# Patient Record
Sex: Male | Born: 2000 | Race: Black or African American | Hispanic: No | Marital: Single | Smoking: Current every day smoker
Health system: Southern US, Community
[De-identification: ages and names within clinical notes are randomized; demographics above are authoritative.]

## PROBLEM LIST (undated history)

## (undated) DIAGNOSIS — Z789 Other specified health status: Secondary | ICD-10-CM

## (undated) HISTORY — DX: Other specified health status: Z78.9

---
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
## 2018-09-09 ENCOUNTER — Emergency Department (HOSPITAL_COMMUNITY): Payer: BC Managed Care – PPO

## 2018-09-09 ENCOUNTER — Emergency Department (HOSPITAL_COMMUNITY)
Admission: EM | Admit: 2018-09-09 | Discharge: 2018-09-09 | Disposition: A | Discharge status: Home / Self Care | Payer: BC Managed Care – PPO | Attending: Emergency Medicine | Admitting: Emergency Medicine

## 2018-09-09 ENCOUNTER — Other Ambulatory Visit: Payer: Self-pay

## 2018-09-09 DIAGNOSIS — J069 Acute upper respiratory infection, unspecified: Secondary | ICD-10-CM | POA: Insufficient documentation

## 2018-09-09 DIAGNOSIS — B9789 Other viral agents as the cause of diseases classified elsewhere: Secondary | ICD-10-CM

## 2018-09-09 DIAGNOSIS — H9203 Otalgia, bilateral: Secondary | ICD-10-CM | POA: Diagnosis present

## 2018-09-09 MED ORDER — ACETAMINOPHEN 325 MG PO TABS
650.0000 mg | ORAL_TABLET | Freq: Once | ORAL | Status: AC
  Administered 2018-09-09: 650 mg via ORAL
  Filled 2018-09-09: qty 2

## 2018-09-09 NOTE — ED Notes (Signed)
Patient transported to X-ray 

## 2018-09-09 NOTE — ED Provider Notes (Signed)
[facility] EMERGENCY DEPARTMENT Provider Note   CSN: [id number] Arrival date & time: 09/09/18  0539    History   Chief Complaint Chief Complaint  Patient presents with  . Nasal Congestion    flu like symptoms    HPI Travis Powers is a 18 y.o. male.     HPI   18 year old male significant medical history, presents with myalgias, cough, rhinorrhea for 3 days.  He notes associated subjective fevers, chills, sputum production, sore throat, bilateral ear pain.  He states he feels like he has the flu.  He denies any associated nausea, vomiting, abdominal pain, chest pain, shortness of breath.  He denies any known sick exposures.  Past Medical History:  Diagnosis Date  . Medical history non-contributory     Patient Active Problem List   Diagnosis Date Noted  . Acne vulgaris 02/21/2015    No past surgical history on file.      Home Medications    Prior to Admission medications   Medication Sig Start Date End Date Taking? Authorizing Provider  clindamycin-benzoyl peroxide (BENZACLIN) gel Apply topically 2 (two) times daily. Patient not taking: Reported on 02/15/2016 02/21/15   Minda Meo, MD    Family History Family History  Problem Relation Age of Onset  . Sickle cell trait Mother   . Diabetes Maternal Grandmother   . Sickle cell trait Maternal Grandfather     Social History Social History   Tobacco Use  . Smoking status: Never Smoker  . Smokeless tobacco: Never Used  Substance Use Topics  . Alcohol use: No  . Drug use: No     Allergies   Patient has no known allergies.   Review of Systems Review of Systems  Constitutional: Negative for chills and fever.  HENT: Positive for congestion, ear pain, rhinorrhea and sore throat.   Respiratory: Positive for cough. Negative for shortness of breath.   Cardiovascular: Negative for chest pain.  Gastrointestinal: Negative for abdominal pain, nausea and vomiting.  Skin: Negative for rash and  wound.     Physical Exam Updated Vital Signs BP 131/77 (BP Location: Right Arm)   Pulse 82   Temp 98.9 F (37.2 C) (Oral)   Resp 16   Ht 6\' 4"  (1.93 m)   Wt 72.6 kg   SpO2 100%   BMI 19.48 kg/m   Physical Exam Vitals signs and nursing note reviewed.  Constitutional:      Appearance: He is well-developed.  HENT:     Head: Normocephalic and atraumatic.     Right Ear: Tympanic membrane normal.     Left Ear: Tympanic membrane normal.     Nose: Nose normal.     Mouth/Throat:     Lips: Pink.     Mouth: Mucous membranes are moist.  Eyes:     Conjunctiva/sclera: Conjunctivae normal.  Neck:     Musculoskeletal: Neck supple.  Cardiovascular:     Rate and Rhythm: Normal rate and regular rhythm.     Heart sounds: Normal heart sounds. No murmur.  Pulmonary:     Effort: Pulmonary effort is normal. No respiratory distress.     Breath sounds: Normal breath sounds. No wheezing or rales.  Abdominal:     General: Bowel sounds are normal. There is no distension.     Palpations: Abdomen is soft.     Tenderness: There is no abdominal tenderness.  Musculoskeletal: Normal range of motion.        General: No tenderness or deformity.  Skin:    General: Skin is warm and dry.     Findings: No erythema or rash.  Neurological:     Mental Status: He is alert and oriented to person, place, and time.  Psychiatric:        Behavior: Behavior normal.      ED Treatments / Results  Labs (all labs ordered are listed, but only abnormal results are displayed) Labs Reviewed - No data to display  EKG None  Radiology Dg Chest 2 View  Result Date: 09/09/2018 CLINICAL DATA:  Fever, body aches, and cough since Sunday. EXAM: CHEST - 2 VIEW COMPARISON:  None. FINDINGS: The heart size and mediastinal contours are within normal limits. Both lungs are clear. The visualized skeletal structures are unremarkable. IMPRESSION: No active cardiopulmonary disease. Electronically Signed   By: Obie Dredge  M.D.   On: 09/09/2018 08:16    Procedures Procedures (including critical care time)  Medications Ordered in ED Medications  acetaminophen (TYLENOL) tablet 650 mg (650 mg Oral Given 09/09/18 0742)     Initial Impression / Assessment and Plan / ED Course  I have reviewed the triage vital signs and the nursing notes.  Pertinent labs & imaging results that were available during my care of the patient were reviewed by me and considered in my medical decision making (see chart for details).        Presents with URI symptoms, cough, myalgias, rhinorrhea.  He is very well-appearing, no acute distress, nontoxic, non-lethargic.  Vital signs stable.  He is talking in full sentences, no accessory muscle use, lungs are clear to auscultation bilaterally.  Chest x-ray shows no acute pneumonia, pneumothorax, pleural effusion.  Flu swab not obtained given will not change management.  Encourage symptom management.  Given strict return precautions.  Discussed with patient and he expresses understanding and is agreeable with plan.  No emergent medical conditions identified at this time.  Patient ready and stable for discharge.  Final Clinical Impressions(s) / ED Diagnoses   Final diagnoses:  None    ED Discharge Orders    None       Clayborne Artist, PA-C 09/09/18 1456    Derwood Kaplan, MD 09/12/18 1055

## 2018-09-09 NOTE — Discharge Instructions (Signed)
Take Tylenol or Motrin as needed for body aches.  Drink plenty of fluids.  Rest.  Follow-up with your primary care provider in 2 days for continued evaluation.  Return to the ED immediately for new or worsening symptoms or concerns, such as chest pain, shortness of breath, persistent fevers, vomiting or any concerns at all.

## 2018-09-09 NOTE — ED Triage Notes (Signed)
Per pt he has been having chills, body ache, cough, congestion since Sunday. Tired and weak. No appetite. Pt is alert oriented x 4

## 2019-10-12 IMAGING — CR DG CHEST 2V
2 series · 2 of 2 positions shown · non-contrast
Comparison: None.

CLINICAL DATA: Fever, body aches, and cough since [REDACTED].

EXAM:
CHEST - 2 VIEW

[chest pa]
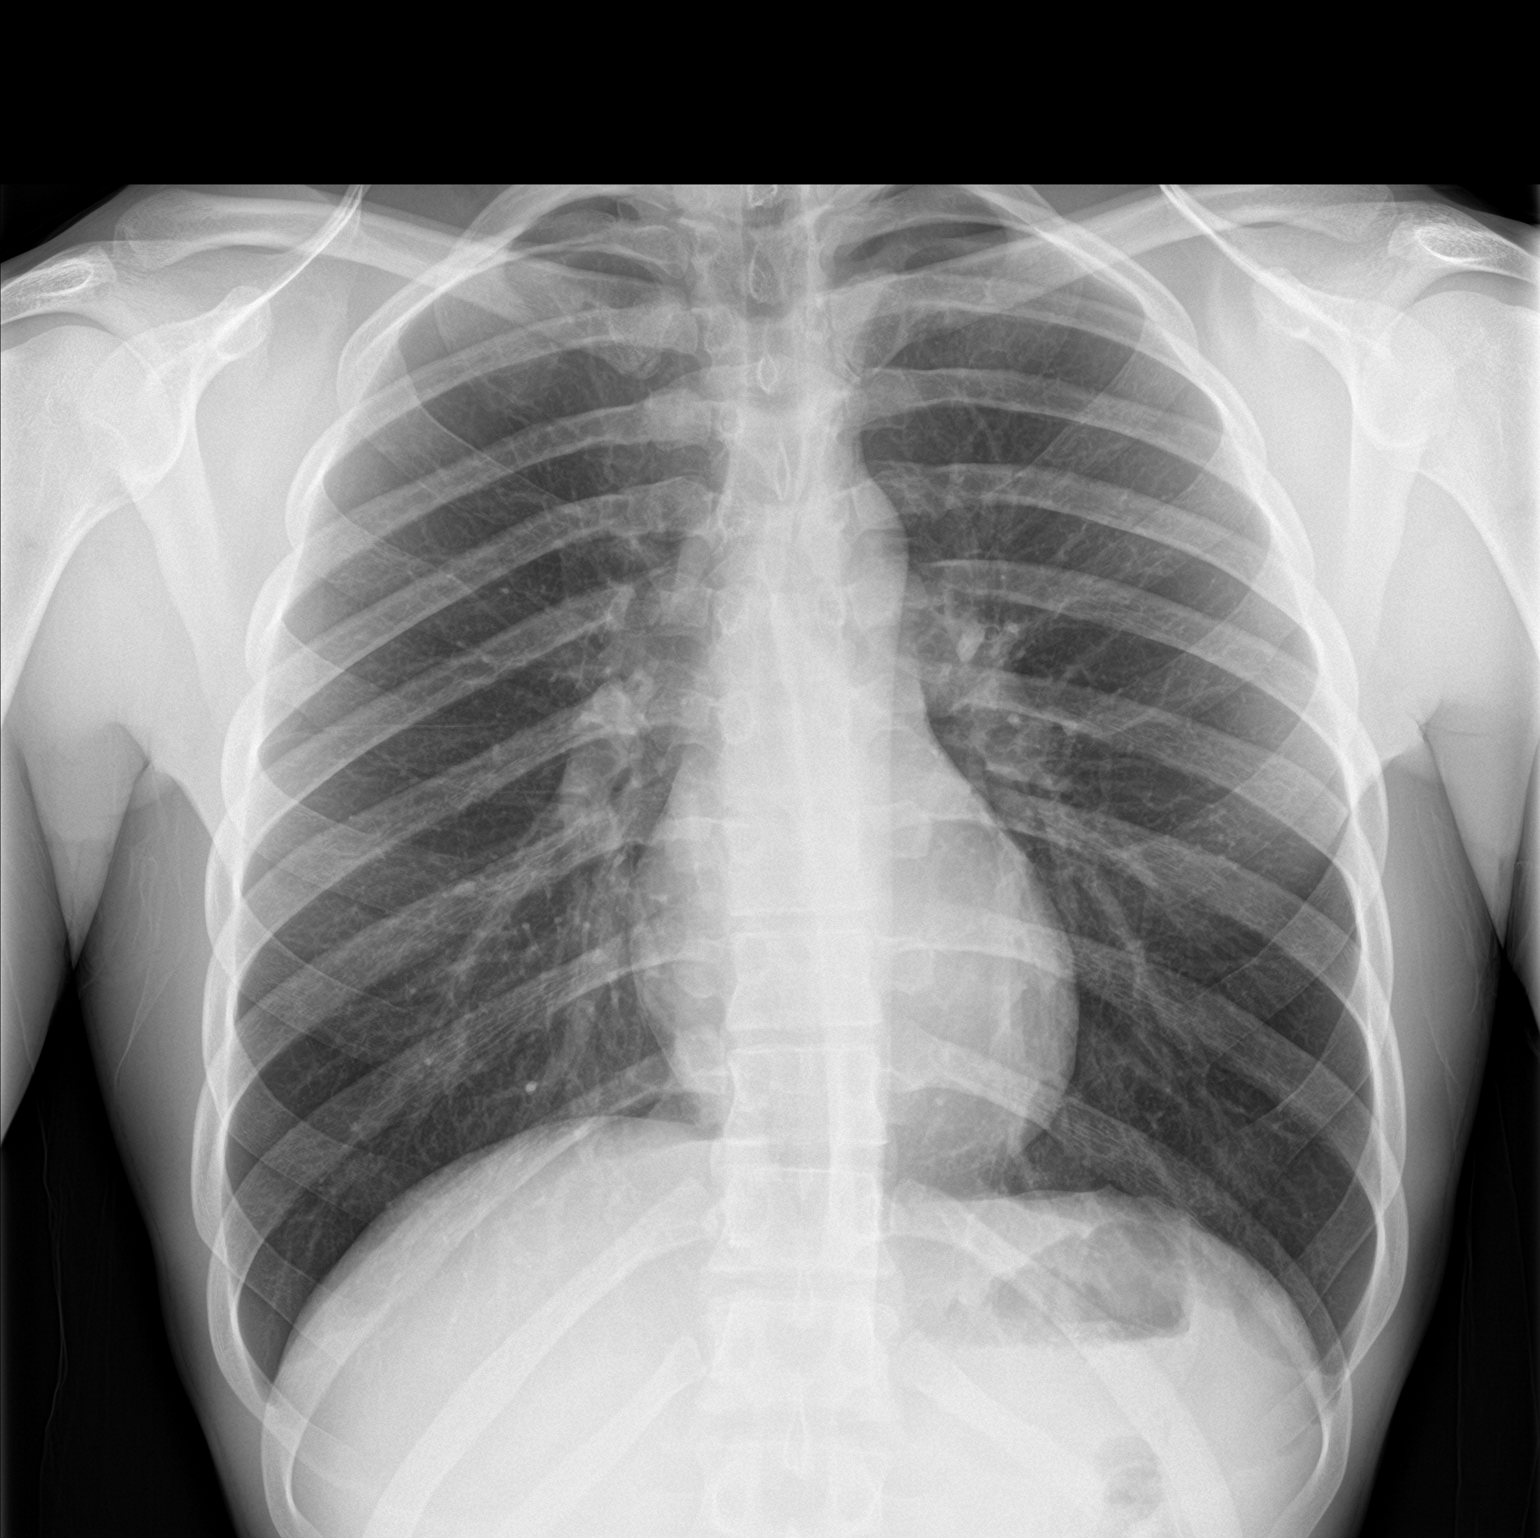

[chest lat]
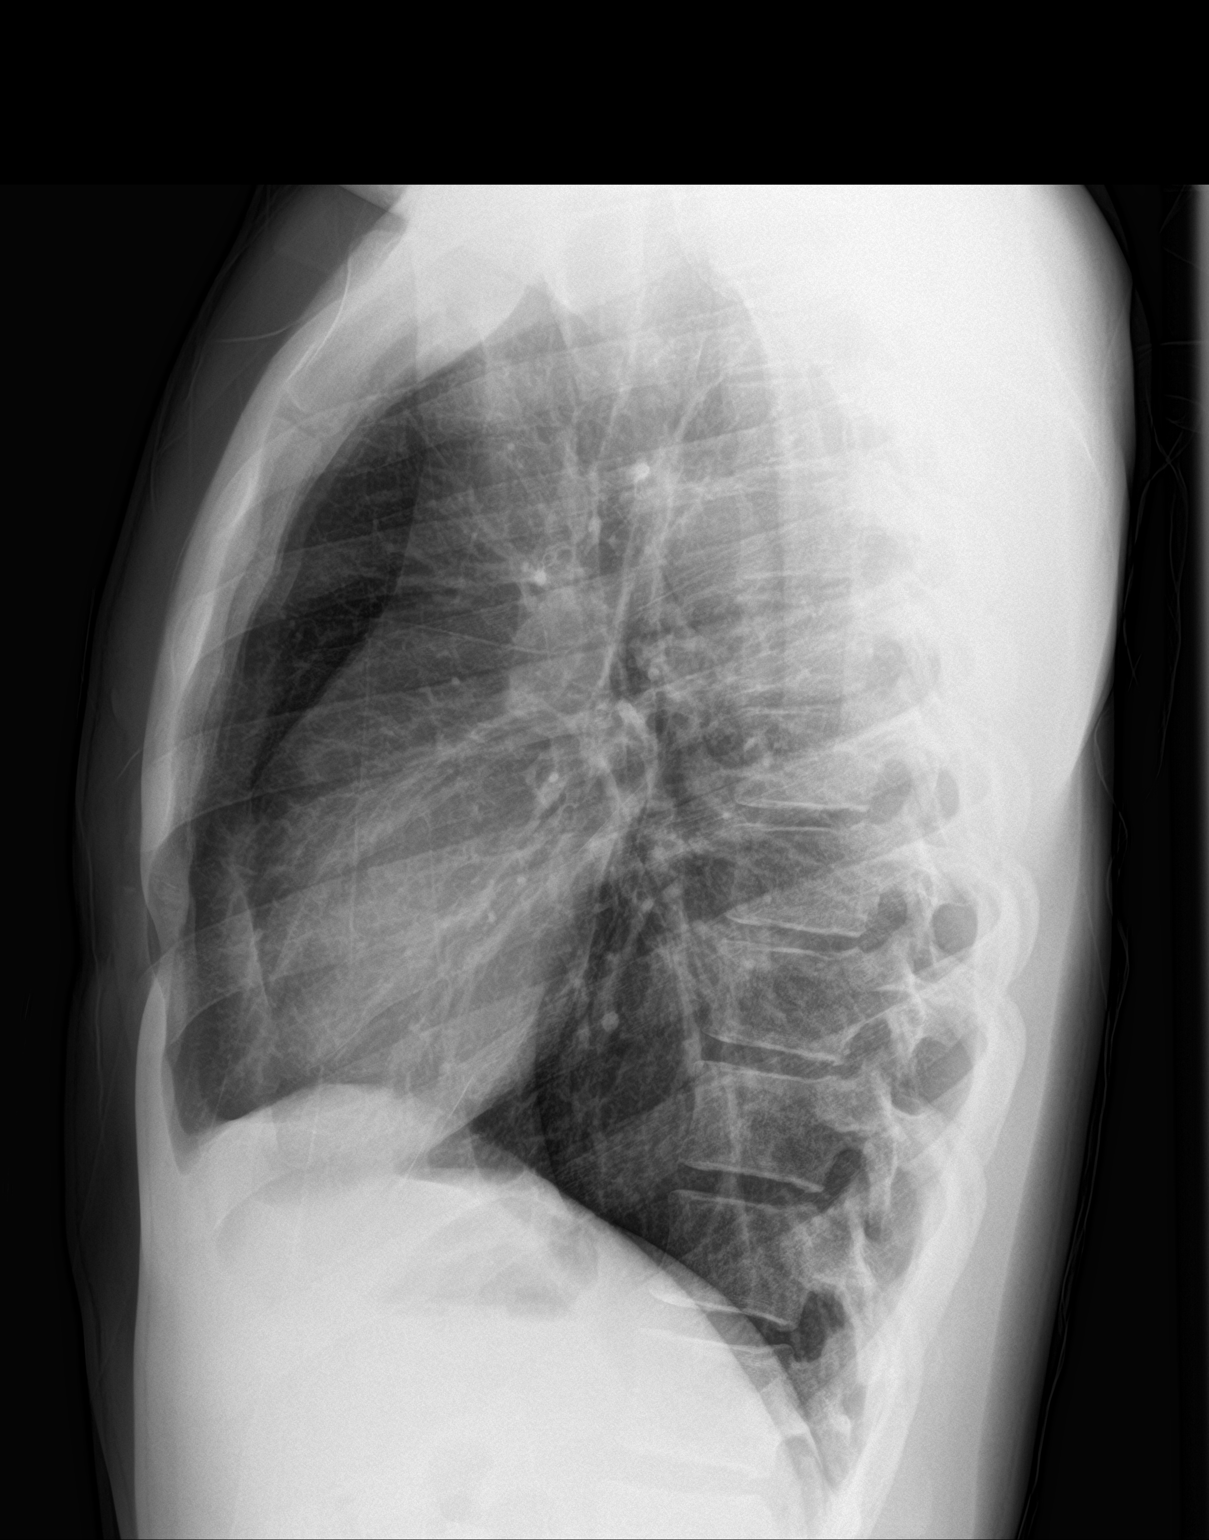

[2 of 2 positions shown; findings below may reference images not displayed]

FINDINGS: The heart size and mediastinal contours are within normal limits.
Both lungs are clear. The visualized skeletal structures are
unremarkable.
IMPRESSION: No active cardiopulmonary disease.

## 2019-12-07 ENCOUNTER — Encounter: Payer: Self-pay | Admitting: Pediatrics

## 2020-03-26 ENCOUNTER — Encounter (HOSPITAL_COMMUNITY): Payer: Self-pay | Admitting: Emergency Medicine

## 2020-03-26 ENCOUNTER — Ambulatory Visit (HOSPITAL_COMMUNITY)
Admission: EM | Admit: 2020-03-26 | Discharge: 2020-03-26 | Disposition: A | Discharge status: Home / Self Care | Payer: BC Managed Care – PPO | Attending: Internal Medicine | Admitting: Internal Medicine

## 2020-03-26 ENCOUNTER — Other Ambulatory Visit: Payer: Self-pay

## 2020-03-26 DIAGNOSIS — Z20822 Contact with and (suspected) exposure to covid-19: Secondary | ICD-10-CM | POA: Diagnosis not present

## 2020-03-26 NOTE — Discharge Instructions (Signed)

## 2020-03-26 NOTE — ED Notes (Signed)
Declined to see provider

## 2020-03-26 NOTE — ED Triage Notes (Signed)
Chills for 4 days, diarrhea, poor sleeping, runny nose, chest congestion

## 2020-03-27 LAB — SARS CORONAVIRUS 2 (TAT 6-24 HRS): SARS Coronavirus 2: NEGATIVE

## 2020-06-09 ENCOUNTER — Other Ambulatory Visit: Payer: Self-pay

## 2020-06-09 ENCOUNTER — Ambulatory Visit
Admission: EM | Admit: 2020-06-09 | Discharge: 2020-06-09 | Disposition: A | Discharge status: Home / Self Care | Payer: BC Managed Care – PPO | Attending: Emergency Medicine | Admitting: Emergency Medicine

## 2020-06-09 DIAGNOSIS — Z202 Contact with and (suspected) exposure to infections with a predominantly sexual mode of transmission: Secondary | ICD-10-CM | POA: Diagnosis not present

## 2020-06-09 MED ORDER — DOXYCYCLINE HYCLATE 100 MG PO CAPS: 100.0000 mg | ORAL_CAPSULE | Freq: Two times a day (BID) | ORAL | 0 refills | Status: AC

## 2020-06-09 MED ORDER — CEFTRIAXONE SODIUM 500 MG IJ SOLR
500.0000 mg | Freq: Once | INTRAMUSCULAR | Status: AC
  Administered 2020-06-09: 500 mg via INTRAMUSCULAR

## 2020-06-09 MED ORDER — METRONIDAZOLE 500 MG PO TABS
2000.0000 mg | ORAL_TABLET | Freq: Once | ORAL | Status: AC
  Administered 2020-06-09: 2000 mg via ORAL

## 2020-06-09 NOTE — ED Provider Notes (Signed)
____________________________________________  Time seen: Approximately 1:12 PM  I have reviewed the triage vital signs and the nursing notes.   HISTORY  Chief Complaint S74.5   Historian Patient     HPI Travis Powers is a 19 y.o. male presents to the urgent care with concern for STD exposure.  Patient states that his girlfriend recently tested positive for chlamydia.  No penile discharge, dysuria or increased urinary frequency.  Patient denies low back pain, nausea or vomiting.  No other alleviating measures have been attempted.   Past Medical History:  Diagnosis Date  . Medical history non-contributory      Immunizations up to date:  Yes.     Past Medical History:  Diagnosis Date  . Medical history non-contributory     Patient Active Problem List   Diagnosis Date Noted  . Acne vulgaris 02/21/2015    History reviewed. No pertinent surgical history.  Prior to Admission medications   Medication Sig Start Date End Date Taking? Authorizing Provider  amoxicillin (AMOXIL) 500 MG capsule Take 500 mg by mouth 3 (three) times daily. 05/10/20   [provider]  clindamycin-benzoyl peroxide (BENZACLIN) gel Apply topically 2 (two) times daily. Patient not taking: Reported on 02/15/2016 02/21/15   Minda Meo, MD  doxycycline (VIBRAMYCIN) 100 MG capsule Take 1 capsule (100 mg total) by mouth 2 (two) times daily for 7 days. 06/09/20 06/16/20  Orvil Feil, PA-C    Allergies Patient has no known allergies.  Family History  Problem Relation Age of Onset  . Sickle cell trait Mother   . Diabetes Maternal Grandmother   . Sickle cell trait Maternal Grandfather   . Healthy Father     Social History Social History   Tobacco Use  . Smoking status: Never Smoker  . Smokeless tobacco: Never Used  Substance Use Topics  . Alcohol use: Yes  . Drug use: Yes    Types: Marijuana     Review of Systems  Constitutional: No fever/chills Eyes:  No  discharge ENT: No upper respiratory complaints. Respiratory: no cough. No SOB/ use of accessory muscles to breath Gastrointestinal:   No nausea, no vomiting.  No diarrhea.  No constipation. Musculoskeletal: Negative for musculoskeletal pain. Skin: Negative for rash, abrasions, lacerations, ecchymosis.    ____________________________________________   PHYSICAL EXAM:  VITAL SIGNS: ED Triage Vitals  Enc Vitals Group     BP 06/09/20 1213 120/83     Pulse Rate 06/09/20 1213 63     Resp 06/09/20 1213 18     Temp 06/09/20 1213 98.1 F (36.7 C)     Temp Source 06/09/20 1213 Oral     SpO2 06/09/20 1213 98 %     Weight --      Height --      Head Circumference --      Peak Flow --      Pain Score 06/09/20 1212 0     Pain Loc --      Pain Edu? --      Excl. in GC? --      Constitutional: Alert and oriented. Well appearing and in no acute distress. Eyes: Conjunctivae are normal. PERRL. EOMI. Head: Atraumatic. Cardiovascular: Normal rate, regular rhythm. Normal S1 and S2.  Good peripheral circulation. Respiratory: Normal respiratory effort without tachypnea or retractions. Lungs CTAB. Good air entry to the bases with no decreased or absent breath sounds Skin:  Skin is warm, dry and intact. No rash noted. Psychiatric: Mood and affect are normal  for age. Speech and behavior are normal.   ____________________________________________   LABS (all labs ordered are listed, but only abnormal results are displayed)  Labs Reviewed  CYTOLOGY, (ORAL, ANAL, URETHRAL) ANCILLARY ONLY   ____________________________________________  EKG   ____________________________________________  RADIOLOGY  No results found.  ____________________________________________    PROCEDURES  Procedure(s) performed:     Procedures     Medications  cefTRIAXone (ROCEPHIN) injection 500 mg (500 mg Intramuscular Given 06/09/20 1236)  metroNIDAZOLE (FLAGYL) tablet 2,000 mg (2,000 mg Oral Given  06/09/20 1234)     ____________________________________________   INITIAL IMPRESSION / ASSESSMENT AND PLAN / ED COURSE  Pertinent labs & imaging results that were available during my care of the patient were reviewed by me and considered in my medical decision making (see chart for details).    Assessment and plan STD exposure 19 year old male presents to the urgent care with concern for possible STD exposure.  Vital signs are reassuring at triage.  On physical exam, patient was alert, active and nontoxic-appearing.  He denies penile discharge, dysuria, low back pain or increased urinary frequency.  Patient elected to be empirically treated for STDs.  He was given Rocephin IM in the urgent care as well as 2 g of Flagyl.  He was discharged with doxycycline twice daily for the next 7 days.  Patient was advised to abstain from unprotected sex for the next 2 weeks.  All patient questions were answered.     ____________________________________________  FINAL CLINICAL IMPRESSION(S) / ED DIAGNOSES  Final diagnoses:  STD exposure      NEW MEDICATIONS STARTED DURING THIS VISIT:  ED Discharge Orders         Ordered    doxycycline (VIBRAMYCIN) 100 MG capsule  2 times daily        06/09/20 1231              This chart was dictated using voice recognition software/Dragon. Despite best efforts to proofread, errors can occur which can change the meaning. Any change was purely unintentional.     Orvil Feil, PA-C 06/09/20 1318

## 2020-06-09 NOTE — Discharge Instructions (Addendum)
Take doxycycline twice daily for the next 7 days. Please abstain from unprotected sex for the next 2 weeks.

## 2020-06-09 NOTE — ED Triage Notes (Signed)
Pt is here wanting STD testing after informing him 2 days ago that she tested POSITIVE for Chlamydia, pt last had unprotected sex 5 days ago.

## 2020-06-14 LAB — CYTOLOGY, (ORAL, ANAL, URETHRAL) ANCILLARY ONLY
Chlamydia: NEGATIVE
Comment: NEGATIVE
Comment: NEGATIVE
Comment: NORMAL
Neisseria Gonorrhea: NEGATIVE
Trichomonas: NEGATIVE

## 2021-07-26 ENCOUNTER — Telehealth: Payer: BC Managed Care – PPO | Admitting: Nurse Practitioner

## 2021-07-26 DIAGNOSIS — J0140 Acute pansinusitis, unspecified: Secondary | ICD-10-CM

## 2021-07-26 MED ORDER — AMOXICILLIN-POT CLAVULANATE 875-125 MG PO TABS: 1.0000 | ORAL_TABLET | Freq: Two times a day (BID) | ORAL | 0 refills | Status: AC

## 2021-07-26 NOTE — Progress Notes (Signed)

## 2021-08-24 ENCOUNTER — Telehealth: Payer: Self-pay

## 2021-08-24 ENCOUNTER — Telehealth: Payer: Self-pay | Admitting: Physician Assistant

## 2021-08-24 DIAGNOSIS — K047 Periapical abscess without sinus: Secondary | ICD-10-CM

## 2021-08-24 MED ORDER — MELOXICAM 15 MG PO TABS: 15.0000 mg | ORAL_TABLET | Freq: Every day | ORAL | 0 refills | Status: AC

## 2021-08-24 MED ORDER — CLINDAMYCIN HCL 300 MG PO CAPS: 300.0000 mg | ORAL_CAPSULE | Freq: Three times a day (TID) | ORAL | 0 refills | Status: AC

## 2021-08-24 NOTE — Progress Notes (Signed)
Virtual Visit Consent   Travis Powers, you are scheduled for a virtual visit with a [facility] provider today.     Just as with appointments in the office, your consent must be obtained to participate.  Your consent will be active for this visit and any virtual visit you may have with one of our providers in the next 365 days.     If you have a MyChart account, a copy of this consent can be sent to you electronically.  All virtual visits are billed to your insurance company just like a traditional visit in the office.    As this is a virtual visit, video technology does not allow for your provider to perform a traditional examination.  This may limit your provider's ability to fully assess your condition.  If your provider identifies any concerns that need to be evaluated in person or the need to arrange testing (such as labs, EKG, etc.), we will make arrangements to do so.     Although advances in technology are sophisticated, we cannot ensure that it will always work on either your end or our end.  If the connection with a video visit is poor, the visit may have to be switched to a telephone visit.  With either a video or telephone visit, we are not always able to ensure that we have a secure connection.     I need to obtain your verbal consent now.   Are you willing to proceed with your visit today?    Travis Powers has provided verbal consent on 08/24/2021 for a virtual visit (video or telephone).   Piedad Climes, [place]   Date: 08/24/2021 7:38 PM   Virtual Visit via Video Note   I, Piedad Climes, connected with  Travis Powers  ([id number], [birth year]) on 08/24/21 at  7:30 PM EST by a video-enabled telemedicine application and verified that I am speaking with the correct person using two identifiers.  Location: Patient: Virtual Visit Location Patient: Home Provider: Virtual Visit Location Provider: Home Office   I discussed the limitations of evaluation and management by  telemedicine and the availability of in person appointments. The patient expressed understanding and agreed to proceed.    History of Present Illness: Travis Powers is a 21 y.o. who identifies as a male who was assigned male at birth, and is being seen today for possible dental abscess of broken tooth. Today he has noted some left facial swelling with upper tooth pain -- left upper premolar. Denies fever, chills. Denies throat pain. Notes significant gum swelling around the tooth as well. Notes similar issue about 3 months or so ago.   HPI: HPI  Problems: There are no problems to display for this patient.   Allergies: No Known Allergies Medications:  Current Outpatient Medications:    clindamycin (CLEOCIN) 300 MG capsule, Take 1 capsule (300 mg total) by mouth 3 (three) times daily for 7 days., Disp: 21 capsule, Rfl: 0   meloxicam (MOBIC) 15 MG tablet, Take 1 tablet (15 mg total) by mouth daily., Disp: 15 tablet, Rfl: 0  Observations/Objective: Patient is well-developed, well-nourished in no acute distress.  Resting comfortably at home.  Head is normocephalic, atraumatic.  No labored breathing. Speech is clear and coherent with logical content.  Patient is alert and oriented at baseline.   Assessment and Plan: 1. Dental abscess - clindamycin (CLEOCIN) 300 MG capsule; Take 1 capsule (300 mg total) by mouth 3 (three) times daily for 7 days.  Dispense:  21 capsule; Refill: 0 - meloxicam (MOBIC) 15 MG tablet; Take 1 tablet (15 mg total) by mouth daily.  Dispense: 15 tablet; Refill: 0  Start Clindamycin as he was on Augmentin for sinusitis last month. Start probiotic. Supportive measures and OTC medications reviewed. Meloxicam per orders. He is to call his dentist tomorrow morning to get scheduled for a follow-up. ER precautions reviewed.   Follow Up Instructions: I discussed the assessment and treatment plan with the patient. The patient was provided an opportunity to ask questions and all  were answered. The patient agreed with the plan and demonstrated an understanding of the instructions.  A copy of instructions were sent to the patient via MyChart unless otherwise noted below.   The patient was advised to call back or seek an in-person evaluation if the symptoms worsen or if the condition fails to improve as anticipated.  Time:  I spent 10 minutes with the patient via telehealth technology discussing the above problems/concerns.    Piedad Climes, PA-C

## 2021-08-24 NOTE — Patient Instructions (Signed)
°  Ashok Pall, thank you for joining Leeanne Rio, PA-C for today's virtual visit.  While this provider is not your primary care provider (PCP), if your PCP is located in our provider database this encounter information will be shared with them immediately following your visit.  Consent: (Patient) Dagim Tousignant provided verbal consent for this virtual visit at the beginning of the encounter.  Current Medications: No current outpatient medications on file.   Medications ordered in this encounter:  No orders of the defined types were placed in this encounter.    *If you need refills on other medications prior to your next appointment, please contact your pharmacy*  Follow-Up: Call back or seek an in-person evaluation if the symptoms worsen or if the condition fails to improve as anticipated.  Other Instructions Please keep hydrated. Continue good dental hygiene but be gentle in that area. I recommend OTC Peroxyl mouth wash 2-3 times daily.  Start the antibiotic and take as directed with food. Start a daily probiotic as discussed.  Use the Meloxicam once daily for pain and inflammation. You can take Tylenol along with this for breakthrough pain.  Tomorrow morning, contact your dentist to get scheduled for a follow-up.  If you note any new or worsening symptoms before that visit, you need to be evaluated at a local Urgent Care or ER facility.    If you have been instructed to have an in-person evaluation today at a local Urgent Care facility, please use the link below. It will take you to a list of all of our available [facility], including address, phone number and hours of operation. Please do not delay care.  [facility]  If you or a family member do not have a primary care provider, use the link below to schedule a visit and establish care. When you choose a [facility] primary care physician or advanced practice provider, you gain a long-term  partner in health. Find a Primary Care Provider  Learn more about 's in-office and virtual care options: [facility]

## 2021-10-20 ENCOUNTER — Telehealth: Payer: Self-pay | Admitting: Nurse Practitioner

## 2021-10-20 DIAGNOSIS — J0100 Acute maxillary sinusitis, unspecified: Secondary | ICD-10-CM

## 2021-10-20 MED ORDER — DOXYCYCLINE HYCLATE 100 MG PO TABS: 100.0000 mg | ORAL_TABLET | Freq: Two times a day (BID) | ORAL | 0 refills | Status: AC

## 2021-10-20 MED ORDER — FLUTICASONE PROPIONATE 50 MCG/ACT NA SUSP: 2.0000 | Freq: Every day | NASAL | 6 refills | Status: AC

## 2021-10-20 NOTE — Patient Instructions (Signed)

## 2021-10-20 NOTE — Progress Notes (Signed)
? ?Virtual Visit Consent  ? ?Travis Powers, you are scheduled for a virtual visit with Travis Daphine Deutscher, FNP, a [facility] provider, today.   ?  ?Just as with appointments in the office, your consent must be obtained to participate.  Your consent will be active for this visit and any virtual visit you may have with one of our providers in the next 365 days.   ?  ?If you have a MyChart account, a copy of this consent can be sent to you electronically.  All virtual visits are billed to your insurance company just like a traditional visit in the office.   ? ?As this is a virtual visit, video technology does not allow for your provider to perform a traditional examination.  This may limit your provider's ability to fully assess your condition.  If your provider identifies any concerns that need to be evaluated in person or the need to arrange testing (such as labs, EKG, etc.), we will make arrangements to do so.   ?  ?Although advances in technology are sophisticated, we cannot ensure that it will always work on either your end or our end.  If the connection with a video visit is poor, the visit may have to be switched to a telephone visit.  With either a video or telephone visit, we are not always able to ensure that we have a secure connection.    ? ?I need to obtain your verbal consent now.   Are you willing to proceed with your visit today? YES ?  ?Travis Powers has provided verbal consent on 10/20/2021 for a virtual visit (video or telephone). ?  ?Travis Daphine Deutscher, FNP  ? ?Date: 10/20/2021 7:28 PM ? ? ?Virtual Visit via Video Note  ? ?I, Travis Powers, connected with Travis Powers ([id number], 06/20/2001) on 10/20/21 at  7:30 PM EDT by a video-enabled telemedicine application and verified that I am speaking with the correct person using two identifiers. ? ?Location: ?Patient: Virtual Visit Location Patient: Home ?Provider: Virtual Visit Location Provider: Mobile ?  ?I discussed the limitations of  evaluation and management by telemedicine and the availability of in person appointments. The patient expressed understanding and agreed to proceed.   ? ?History of Present Illness: ?Travis Powers is a 21 y.o. who identifies as a male who was assigned male at birth, and is being seen today for sinusitis. ? ?HPI: Sinusitis ?This is a new problem. The current episode started more than 1 month ago. The problem has been gradually worsening since onset. There has been no fever. His pain is at a severity of 8/10. The pain is moderate. Associated symptoms include congestion, headaches and sinus pressure. Pertinent negatives include no coughing, sneezing or sore throat. Past treatments include nothing (he was given augmentin back in january but that did not help at all.). The treatment provided mild relief.   ?Review of Systems  ?HENT:  Positive for congestion and sinus pressure. Negative for sneezing and sore throat.   ?Respiratory:  Negative for cough.   ?Neurological:  Positive for headaches.  ? ?Problems: There are no problems to display for this patient. ?  ?Allergies: No Known Allergies ?Medications:  ?Current Outpatient Medications:  ?  doxycycline (VIBRA-TABS) 100 MG tablet, Take 1 tablet (100 mg total) by mouth 2 (two) times daily. 1 po bid, Disp: 20 tablet, Rfl: 0 ?  fluticasone (FLONASE) 50 MCG/ACT nasal spray, Place 2 sprays into both nostrils daily., Disp: 16 g, Rfl: 6 ?  meloxicam (MOBIC) 15  MG tablet, Take 1 tablet (15 mg total) by mouth daily., Disp: 15 tablet, Rfl: 0 ? ?Observations/Objective: ?Patient is well-developed, well-nourished in no acute distress.  ?Resting comfortably  at home.  ?Head is normocephalic, atraumatic.  ?No labored breathing.  ?Speech is clear and coherent with logical content.  ?Patient is alert and oriented at baseline.  ?Patient showing he feels pressure in frontal sinus and maxillary sinu on right. ? ?Assessment and Plan: ? ?Travis Powers in today with chief complaint of  Sinusitis ? ? ?1. Acute non-recurrent maxillary sinusitis ?1. Take meds as prescribed ?2. Use a cool mist humidifier especially during the winter months and when heat has been humid. ?3. Use saline nose sprays frequently ?4. Saline irrigations of the nose can be very helpful if done frequently. ? * 4X daily for 1 week* ? * Use of a nettie pot can be helpful with this. Follow directions with this* ?5. Drink plenty of fluids ?6. Keep thermostat turn down low ?7.For any cough or congestion- mucinex OTC if needed ?8. For fever or aces or pains- take tylenol or ibuprofen appropriate for age and weight. ? * for fevers greater than 101 orally you may alternate ibuprofen and tylenol every  3 hours. ?  ?Meds ordered this encounter  ?Medications  ? doxycycline (VIBRA-TABS) 100 MG tablet  ?  Sig: Take 1 tablet (100 mg total) by mouth 2 (two) times daily. 1 po bid  ?  Dispense:  20 tablet  ?  Refill:  0  ?  Order Specific Question:   Supervising Provider  ?  Answer:   Eber Hong [[id number]]  ? fluticasone (FLONASE) 50 MCG/ACT nasal spray  ?  Sig: Place 2 sprays into both nostrils daily.  ?  Dispense:  16 g  ?  Refill:  6  ?  Order Specific Question:   Supervising Provider  ?  Answer:   Eber Hong [[id number]]  ? ? ? ? ? ? ? ?Follow Up Instructions: ?I discussed the assessment and treatment plan with the patient. The patient was provided an opportunity to ask questions and all were answered. The patient agreed with the plan and demonstrated an understanding of the instructions.  A copy of instructions were sent to the patient via MyChart. ? ?The patient was advised to call back or seek an in-person evaluation if the symptoms worsen or if the condition fails to improve as anticipated. ? ?Time:  ?I spent 8 minutes with the patient via telehealth technology discussing the above problems/concerns.   ? ?Travis Daphine Deutscher, FNP ? ?

## 2022-01-08 ENCOUNTER — Encounter (HOSPITAL_BASED_OUTPATIENT_CLINIC_OR_DEPARTMENT_OTHER): Payer: Self-pay

## 2022-01-08 ENCOUNTER — Emergency Department (HOSPITAL_BASED_OUTPATIENT_CLINIC_OR_DEPARTMENT_OTHER)
Admission: EM | Admit: 2022-01-08 | Discharge: 2022-01-08 | Disposition: A | Discharge status: Home / Self Care | Payer: Self-pay | Attending: Emergency Medicine | Admitting: Emergency Medicine

## 2022-01-08 ENCOUNTER — Emergency Department (HOSPITAL_BASED_OUTPATIENT_CLINIC_OR_DEPARTMENT_OTHER): Payer: Self-pay

## 2022-01-08 ENCOUNTER — Other Ambulatory Visit: Payer: Self-pay

## 2022-01-08 DIAGNOSIS — Z23 Encounter for immunization: Secondary | ICD-10-CM | POA: Insufficient documentation

## 2022-01-08 DIAGNOSIS — S81811A Laceration without foreign body, right lower leg, initial encounter: Secondary | ICD-10-CM | POA: Insufficient documentation

## 2022-01-08 DIAGNOSIS — W25XXXA Contact with sharp glass, initial encounter: Secondary | ICD-10-CM | POA: Insufficient documentation

## 2022-01-08 LAB — BASIC METABOLIC PANEL
Anion gap: 6 (ref 5–15)
BUN: 11 mg/dL (ref 6–20)
CO2: 28 mmol/L (ref 22–32)
Calcium: 8.9 mg/dL (ref 8.9–10.3)
Chloride: 106 mmol/L (ref 98–111)
Creatinine, Ser: 0.88 mg/dL (ref 0.61–1.24)
GFR, Estimated: >60 mL/min (ref >60)
Glucose, Bld: 103 mg/dL — ABNORMAL HIGH (ref 70–99)
Potassium: 3.3 mmol/L — ABNORMAL LOW (ref 3.5–5.1)
Sodium: 140 mmol/L (ref 135–145)

## 2022-01-08 LAB — CBC WITH DIFFERENTIAL/PLATELET
Abs Immature Granulocytes: 0.01 10*3/uL (ref 0.00–0.07)
Basophils Absolute: 0 10*3/uL (ref 0.0–0.1)
Basophils Relative: 0 %
Eosinophils Absolute: 0.1 10*3/uL (ref 0.0–0.5)
Eosinophils Relative: 1 %
HCT: 37.9 % — ABNORMAL LOW (ref 39.0–52.0)
Hemoglobin: 13.2 g/dL (ref 13.0–17.0)
Immature Granulocytes: 0 %
Lymphocytes Relative: 26 %
Lymphs Abs: 2 10*3/uL (ref 0.7–4.0)
MCH: 31.5 pg (ref 26.0–34.0)
MCHC: 34.8 g/dL (ref 30.0–36.0)
MCV: 90.5 fL (ref 80.0–100.0)
Monocytes Absolute: 0.7 10*3/uL (ref 0.1–1.0)
Monocytes Relative: 8 %
Neutro Abs: 5 10*3/uL (ref 1.7–7.7)
Neutrophils Relative %: 65 %
Platelets: 209 10*3/uL (ref 150–400)
RBC: 4.19 MIL/uL — ABNORMAL LOW (ref 4.22–5.81)
RDW: 12.1 % (ref 11.5–15.5)
WBC: 7.7 10*3/uL (ref 4.0–10.5)
nRBC: 0 % (ref 0.0–0.2)

## 2022-01-08 MED ORDER — LIDOCAINE-EPINEPHRINE-TETRACAINE (LET) TOPICAL GEL: 3.0000 mL | Freq: Once | TOPICAL | Status: DC

## 2022-01-08 MED ORDER — LIDOCAINE-EPINEPHRINE-TETRACAINE (LET) TOPICAL GEL
3.0000 mL | Freq: Once | TOPICAL | Status: AC
  Administered 2022-01-08: 3 mL via TOPICAL
  Filled 2022-01-08: qty 3

## 2022-01-08 MED ORDER — IOHEXOL 350 MG/ML SOLN
100.0000 mL | Freq: Once | INTRAVENOUS | Status: AC | PRN
  Administered 2022-01-08: 100 mL via INTRAVENOUS

## 2022-01-08 MED ORDER — CEPHALEXIN 250 MG PO CAPS
500.0000 mg | ORAL_CAPSULE | Freq: Once | ORAL | Status: AC
  Administered 2022-01-08: 500 mg via ORAL
  Filled 2022-01-08: qty 2

## 2022-01-08 MED ORDER — CEPHALEXIN 500 MG PO CAPS: 500.0000 mg | ORAL_CAPSULE | Freq: Four times a day (QID) | ORAL | 0 refills | Status: AC

## 2022-01-08 MED ORDER — TETANUS-DIPHTH-ACELL PERTUSSIS 5-2.5-18.5 LF-MCG/0.5 IM SUSY
0.5000 mL | PREFILLED_SYRINGE | Freq: Once | INTRAMUSCULAR | Status: AC
  Administered 2022-01-08: 0.5 mL via INTRAMUSCULAR
  Filled 2022-01-08: qty 0.5
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
## 2023-02-10 IMAGING — CT CT ANGIO EXTREM LOW*R*
2 of 7 series · 11 of 46 positions shown, 12 images · non-contrast
Comparison: None Available.

CLINICAL DATA: Right leg laceration

EXAM:
CT ANGIOGRAPHY OF THE BILATERAL LOWEREXTREMITY
TECHNIQUE: Multidetector CT imaging of the bilateral lowerwas performed using
the standard protocol during bolus administration of intravenous
contrast. Multiplanar CT image reconstructions and MIPs were
obtained to evaluate the vascular anatomy.

[Series 4: axial arterial · axial · arterial · 0.77mm/px · z∈[-1214,-102]mm · 8 of 646 slices shown, 9 images]
[im 45/646  soft-tissue]
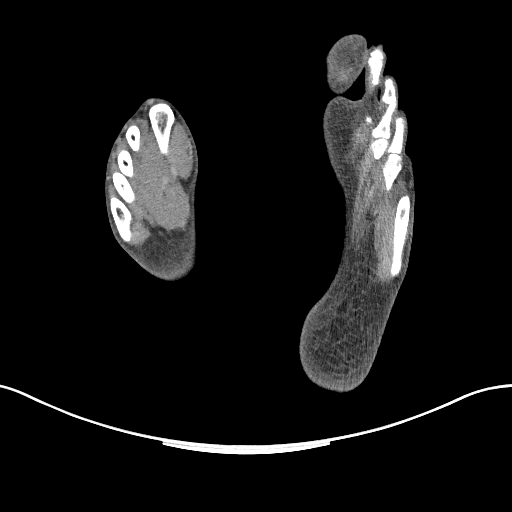
[im 45/646  bone]
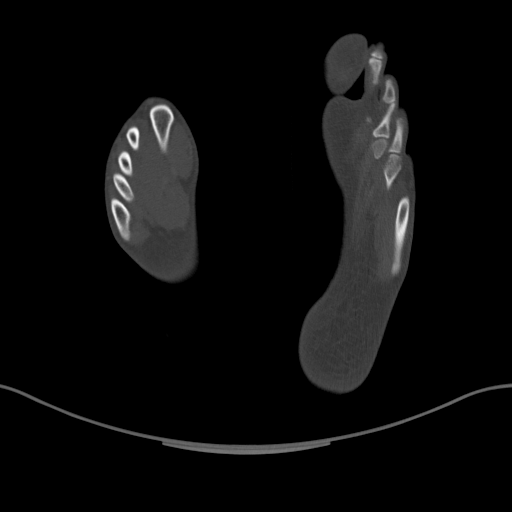
[im 134/646  soft-tissue]
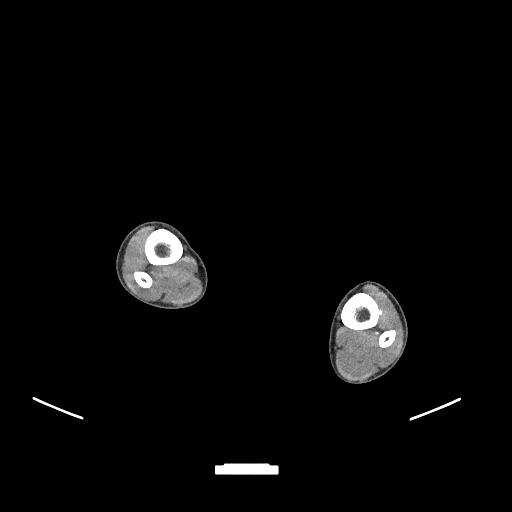
[im 201/646  soft-tissue]
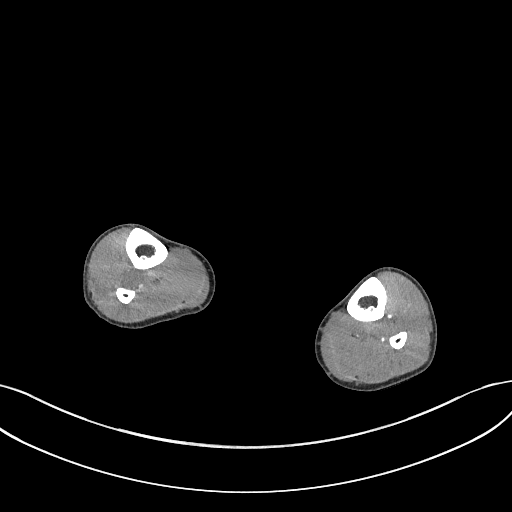
[im 290/646  soft-tissue]
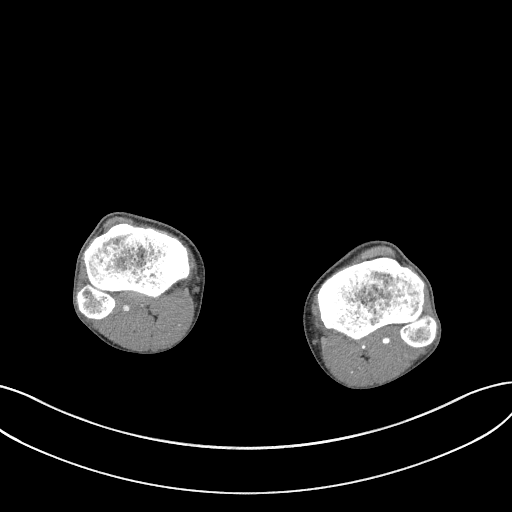
[im 356/646  soft-tissue]
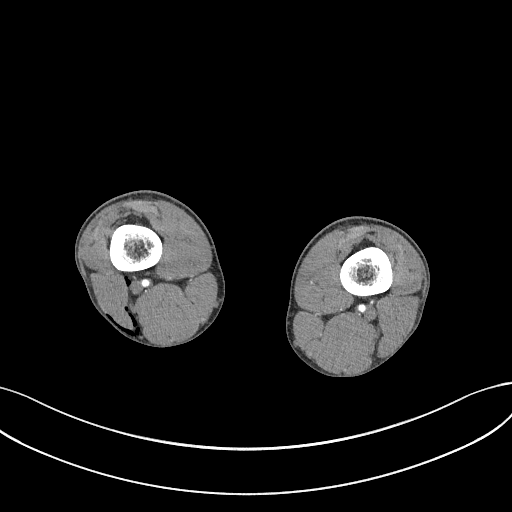
[im 445/646  soft-tissue]
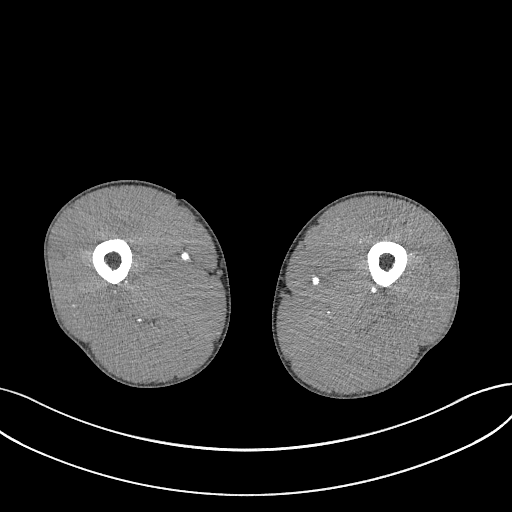
[im 512/646  soft-tissue]
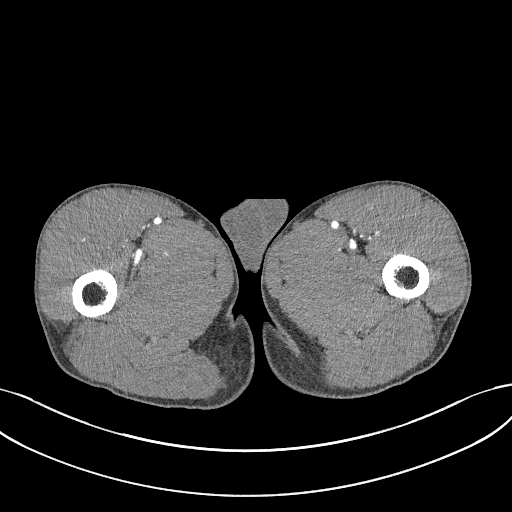
[im 601/646  soft-tissue]
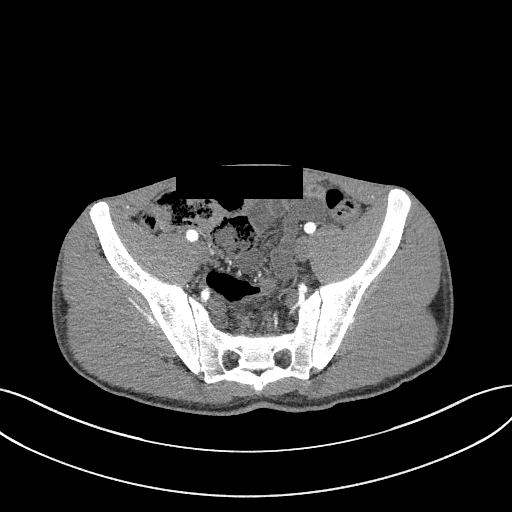

[Series 6: coronal soft · coronal · 0.79mm/px · 3 of 151 slices shown]
[im 38/151  soft-tissue]
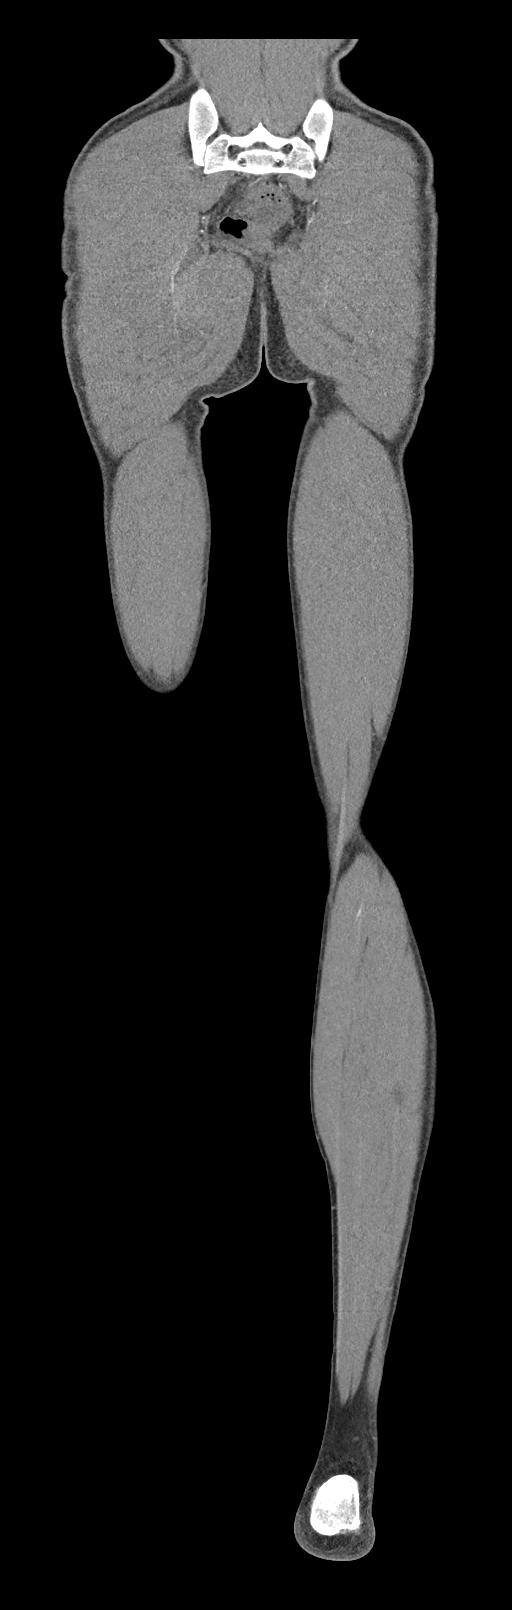
[im 76/151  soft-tissue]
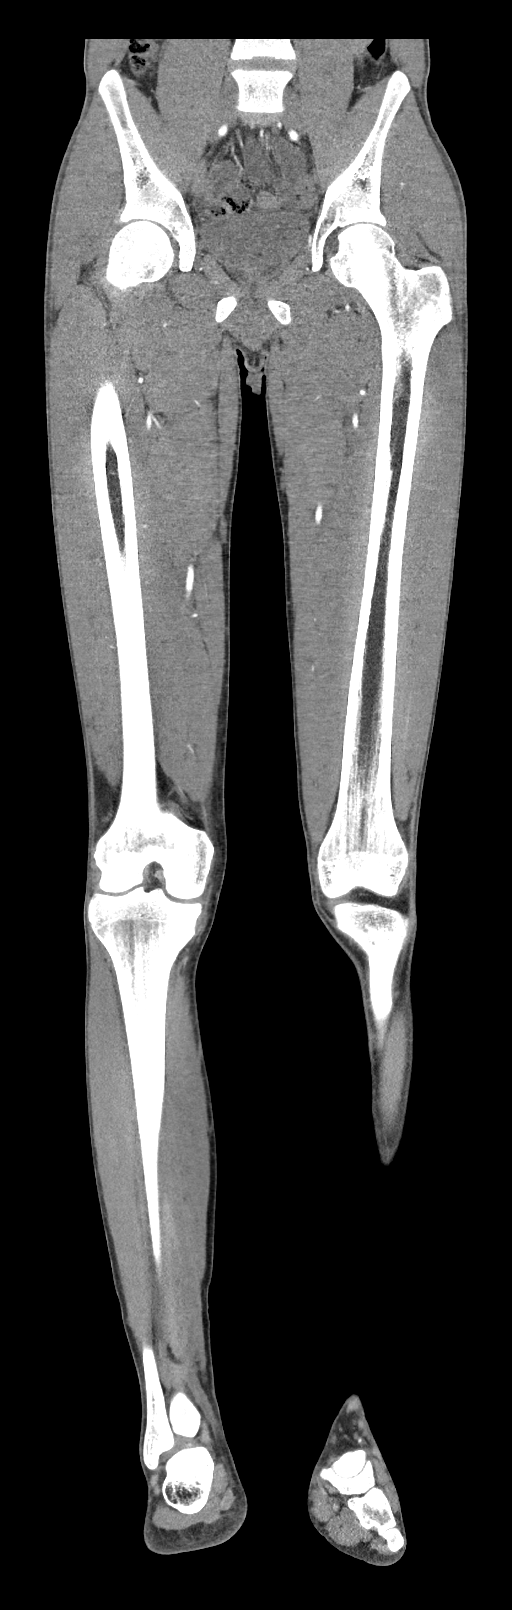
[im 113/151  soft-tissue]
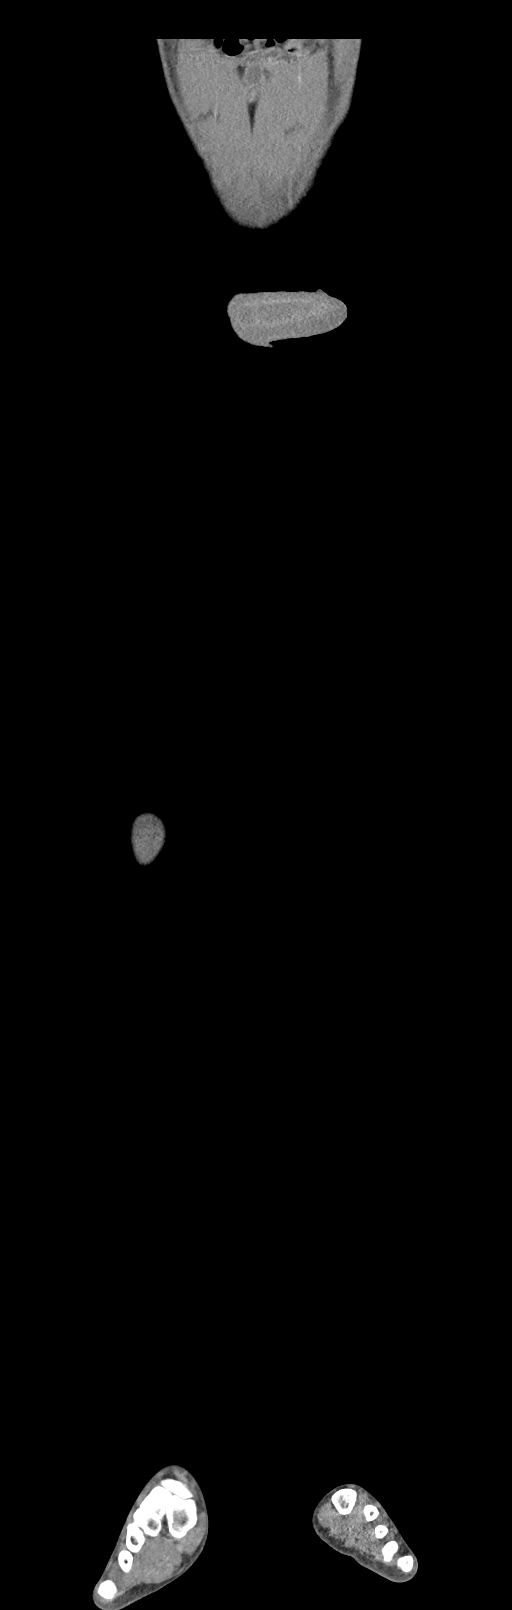

[11 of 46 positions shown; findings below may reference images not displayed]

RADIATION DOSE REDUCTION: This exam was performed according to the
departmental dose-optimization program which includes automated
exposure control, adjustment of the mA and/or kV according to
patient size and/or use of iterative reconstruction technique.

CONTRAST:  100mL OMNIPAQUE IOHEXOL 350 MG/ML SOLN
FINDINGS: Visualized lower extremity arterial inflow is widely patent.
Internal iliac arteries are patent bilaterally.

Lower extremity arterial outflow is widely patent. No aneurysm or
dissection.

Bilateral lower extremity arterial runoff demonstrates variant
anatomy with early takeoff of the posterior tibial arteries
bilaterally at the level of the femoral condyles. On the right,
there is relatively decreased perfusion of the right lower extremity
leading to poor opacification of the distal anterior tibial and
peroneal arteries. The posterior tibial artery is diminutive and
does not opacify beyond the level of the anterior tibial origin,
though this may be in part related to under filling. No intimal
injury, dissection, aneurysm, or active extravasation in the area of
laceration involving the left popliteal fossa as evidence by
extensive subcutaneous gas in this region. No retained radiopaque
foreign body identified.

On the left, there is terminate shin of the diminutive posterior
tibial artery at the level of the mid diaphysis with reconstitution
of the posterior tibial and plantar arch via the peroneal artery.
The anterior tibial artery continues to form the dorsalis pedis
artery.

Review of the MIP images confirms the above findings.
IMPRESSION: 1. No evidence of acute arterial injury involving the right lower
extremity.
2. Variant anatomy with early takeoff of the posterior tibial
arteries bilaterally at the level of the femoral condyles.
3. Congenitally diminutive posterior tibial arteries bilaterally
with reconstitution of the posterior tibial and plantar arch via the
peroneal artery on the left.
4. Decreased perfusion of the right lower extremity of unclear
etiology leading to poor opacification of the lower extremity
arterial runoff beyond the mid diaphysis of the right lower
extremity.
